# Patient Record
Sex: Male | Born: 1965 | Race: Black or African American | Hispanic: No | Marital: Single | Smoking: Current every day smoker
Health system: Southern US, Community
[De-identification: ages and names within clinical notes are randomized; demographics above are authoritative.]

## PROBLEM LIST (undated history)

## (undated) DIAGNOSIS — I10 Essential (primary) hypertension: Secondary | ICD-10-CM

## (undated) DIAGNOSIS — F32A Depression, unspecified: Secondary | ICD-10-CM

## (undated) DIAGNOSIS — S31139A Puncture wound of abdominal wall without foreign body, unspecified quadrant without penetration into peritoneal cavity, initial encounter: Secondary | ICD-10-CM

## (undated) DIAGNOSIS — S21139A Puncture wound without foreign body of unspecified front wall of thorax without penetration into thoracic cavity, initial encounter: Secondary | ICD-10-CM

---

## 2013-02-05 DIAGNOSIS — G8929 Other chronic pain: Secondary | ICD-10-CM | POA: Insufficient documentation

## 2013-02-05 DIAGNOSIS — G43909 Migraine, unspecified, not intractable, without status migrainosus: Secondary | ICD-10-CM | POA: Insufficient documentation

## 2015-04-04 DIAGNOSIS — T50902A Poisoning by unspecified drugs, medicaments and biological substances, intentional self-harm, initial encounter: Secondary | ICD-10-CM | POA: Insufficient documentation

## 2015-04-04 DIAGNOSIS — F32A Depression, unspecified: Secondary | ICD-10-CM | POA: Insufficient documentation

## 2015-05-15 DIAGNOSIS — S143XXA Injury of brachial plexus, initial encounter: Secondary | ICD-10-CM | POA: Insufficient documentation

## 2017-12-27 DIAGNOSIS — D509 Iron deficiency anemia, unspecified: Secondary | ICD-10-CM | POA: Insufficient documentation

## 2019-07-04 ENCOUNTER — Other Ambulatory Visit: Payer: Self-pay

## 2019-07-04 ENCOUNTER — Emergency Department: Payer: Medicare Other

## 2019-07-04 ENCOUNTER — Emergency Department
Admission: EM | Admit: 2019-07-04 | Discharge: 2019-07-04 | Disposition: A | Discharge status: Home / Self Care | Payer: Medicare Other | Attending: Emergency Medicine | Admitting: Emergency Medicine

## 2019-07-04 DIAGNOSIS — Z20822 Contact with and (suspected) exposure to covid-19: Secondary | ICD-10-CM | POA: Insufficient documentation

## 2019-07-04 DIAGNOSIS — I10 Essential (primary) hypertension: Secondary | ICD-10-CM | POA: Diagnosis not present

## 2019-07-04 DIAGNOSIS — J4521 Mild intermittent asthma with (acute) exacerbation: Secondary | ICD-10-CM | POA: Diagnosis not present

## 2019-07-04 DIAGNOSIS — R0602 Shortness of breath: Secondary | ICD-10-CM | POA: Diagnosis present

## 2019-07-04 LAB — CBC WITH DIFFERENTIAL/PLATELET
Abs Immature Granulocytes: 0.07 10*3/uL (ref 0.00–0.07)
Basophils Absolute: 0 10*3/uL (ref 0.0–0.1)
Basophils Relative: 1 %
Eosinophils Absolute: 0.2 10*3/uL (ref 0.0–0.5)
Eosinophils Relative: 3 %
HCT: 41.1 % (ref 39.0–52.0)
Hemoglobin: 12.6 g/dL — ABNORMAL LOW (ref 13.0–17.0)
Immature Granulocytes: 1 %
Lymphocytes Relative: 36 %
Lymphs Abs: 2.1 10*3/uL (ref 0.7–4.0)
MCH: 26.4 pg (ref 26.0–34.0)
MCHC: 30.7 g/dL (ref 30.0–36.0)
MCV: 86 fL (ref 80.0–100.0)
Monocytes Absolute: 0.4 10*3/uL (ref 0.1–1.0)
Monocytes Relative: 8 %
Neutro Abs: 3.1 10*3/uL (ref 1.7–7.7)
Neutrophils Relative %: 51 %
Platelets: 196 10*3/uL (ref 150–400)
RBC: 4.78 MIL/uL (ref 4.22–5.81)
RDW: 14.6 % (ref 11.5–15.5)
WBC: 5.8 10*3/uL (ref 4.0–10.5)
nRBC: 0 % (ref 0.0–0.2)

## 2019-07-04 LAB — BASIC METABOLIC PANEL
Anion gap: 10 (ref 5–15)
BUN: 10 mg/dL (ref 6–20)
CO2: 21 mmol/L — ABNORMAL LOW (ref 22–32)
Calcium: 8.9 mg/dL (ref 8.9–10.3)
Chloride: 111 mmol/L (ref 98–111)
Creatinine, Ser: 1.18 mg/dL (ref 0.61–1.24)
GFR calc Af Amer: >60 mL/min (ref >60)
GFR calc non Af Amer: >60 mL/min (ref >60)
Glucose, Bld: 114 mg/dL — ABNORMAL HIGH (ref 70–99)
Potassium: 3.4 mmol/L — ABNORMAL LOW (ref 3.5–5.1)
Sodium: 142 mmol/L (ref 135–145)

## 2019-07-04 LAB — POC SARS CORONAVIRUS 2 AG: SARS Coronavirus 2 Ag: NEGATIVE

## 2019-07-04 MED ORDER — PREDNISONE 20 MG PO TABS: 40.0000 mg | ORAL_TABLET | Freq: Every day | ORAL | 0 refills | Status: DC

## 2019-07-04 MED ORDER — AMLODIPINE BESYLATE 5 MG PO TABS
5.0000 mg | ORAL_TABLET | Freq: Once | ORAL | Status: AC
  Administered 2019-07-04: 5 mg via ORAL
  Filled 2019-07-04: qty 1

## 2019-07-04 MED ORDER — ALBUTEROL SULFATE (2.5 MG/3ML) 0.083% IN NEBU
5.0000 mg | INHALATION_SOLUTION | Freq: Once | RESPIRATORY_TRACT | Status: AC
  Administered 2019-07-04: 5 mg via RESPIRATORY_TRACT
  Filled 2019-07-04: qty 6

## 2019-07-04 MED ORDER — AMLODIPINE BESYLATE 5 MG PO TABS: 5.0000 mg | ORAL_TABLET | Freq: Every day | ORAL | 1 refills | Status: AC

## 2019-07-04 MED ORDER — AZITHROMYCIN 250 MG PO TABS: ORAL_TABLET | ORAL | 0 refills | Status: DC

## 2019-07-04 MED ORDER — IPRATROPIUM-ALBUTEROL 0.5-2.5 (3) MG/3ML IN SOLN
3.0000 mL | Freq: Once | RESPIRATORY_TRACT | Status: AC
  Administered 2019-07-04: 19:00:00 3 mL via RESPIRATORY_TRACT
  Filled 2019-07-04: qty 3

## 2019-07-04 MED ORDER — METHYLPREDNISOLONE SODIUM SUCC 125 MG IJ SOLR
125.0000 mg | Freq: Once | INTRAMUSCULAR | Status: AC
  Administered 2019-07-04: 19:00:00 125 mg via INTRAVENOUS
  Filled 2019-07-04: qty 2

## 2019-07-04 MED ORDER — MAGNESIUM SULFATE 2 GM/50ML IV SOLN
2.0000 g | INTRAVENOUS | Status: AC
  Administered 2019-07-04: 2 g via INTRAVENOUS
  Filled 2019-07-04: qty 50
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
## 2019-07-04 NOTE — ED Triage Notes (Signed)
Pt reports hx of astma, states that he started wheezing this am after attempting to smoke a cigarette, pt reports that he has the blue inhaler that doesn't help, he needs the red inhaler, pt was given 2 breathing treatments via ems on the way to ER, auditory wheezing noted, pt states that he feels a little better bp 172/112, heart rate of 104, 100% while receiving breathing treatment per ems, 97.2 oral temp, 18g left forearm

## 2019-10-10 ENCOUNTER — Ambulatory Visit: Payer: Medicare Other | Attending: Internal Medicine

## 2019-10-10 DIAGNOSIS — Z23 Encounter for immunization: Secondary | ICD-10-CM

## 2019-10-10 NOTE — Progress Notes (Signed)
   Covid-19 Vaccination Clinic  Name:  Ryan Stewart    MRN: [id number] DOB: 1966-05-02  10/10/2019  Mr. Ryan Stewart was observed post Covid-19 immunization for 15 minutes without incident. He was provided with Vaccine Information Sheet and instruction to access the V-Safe system.   Mr. Ryan Stewart was instructed to call 911 with any severe reactions post vaccine: Marland Kitchen Difficulty breathing  . Swelling of face and throat  . A fast heartbeat  . A bad rash all over body  . Dizziness and weakness   Immunizations Administered    Name Date Dose VIS Date Route   Pfizer COVID-19 Vaccine 10/10/2019  9:57 AM 0.3 mL 06/12/2019 Intramuscular   Manufacturer: [employer], [employer]   Lot: [id number]   NDC: [address]-8

## 2019-11-03 ENCOUNTER — Ambulatory Visit: Payer: Medicare Other | Attending: Internal Medicine

## 2019-11-03 DIAGNOSIS — Z23 Encounter for immunization: Secondary | ICD-10-CM

## 2019-11-03 NOTE — Progress Notes (Signed)
   Covid-19 Vaccination Clinic  Name:  Ryan Stewart    MRN: [id number] DOB: October 27, 1965  11/03/2019  [name] was observed post Covid-19 immunization for 15 minutes without incident. He was provided with Vaccine Information Sheet and instruction to access the V-Safe system.   [name] was instructed to call 911 with any severe reactions post vaccine: Marland Kitchen Difficulty breathing  . Swelling of face and throat  . A fast heartbeat  . A bad rash all over body  . Dizziness and weakness   Immunizations Administered    Name Date Dose VIS Date Route   Pfizer COVID-19 Vaccine 11/03/2019 12:01 PM 0.3 mL 08/26/2018 Intramuscular   Manufacturer: [employer], [employer]   Lot: [id number]   NDC: [address]-7

## 2020-07-15 ENCOUNTER — Ambulatory Visit
Admission: EM | Admit: 2020-07-15 | Discharge: 2020-07-15 | Disposition: A | Discharge status: Home / Self Care | Payer: Medicare Other | Attending: Family Medicine | Admitting: Family Medicine

## 2020-07-15 ENCOUNTER — Ambulatory Visit: Payer: Self-pay

## 2020-07-15 ENCOUNTER — Other Ambulatory Visit: Payer: Self-pay

## 2020-07-15 DIAGNOSIS — Z20822 Contact with and (suspected) exposure to covid-19: Secondary | ICD-10-CM | POA: Diagnosis not present

## 2020-07-15 DIAGNOSIS — U071 COVID-19: Secondary | ICD-10-CM | POA: Insufficient documentation

## 2020-07-15 NOTE — Discharge Instructions (Signed)

## 2020-07-15 NOTE — ED Triage Notes (Signed)
Patient states that he was exposed to covid and would like to be tested, currently without symptoms.

## 2020-07-16 LAB — SARS CORONAVIRUS 2 (TAT 6-24 HRS): SARS Coronavirus 2: POSITIVE — AB

## 2020-08-02 ENCOUNTER — Other Ambulatory Visit: Payer: Self-pay

## 2020-08-02 ENCOUNTER — Encounter: Payer: Self-pay | Admitting: Emergency Medicine

## 2020-08-02 ENCOUNTER — Emergency Department
Admission: EM | Admit: 2020-08-02 | Discharge: 2020-08-02 | Disposition: A | Discharge status: Home / Self Care | Payer: Medicare Other | Attending: Emergency Medicine | Admitting: Emergency Medicine

## 2020-08-02 DIAGNOSIS — Z8616 Personal history of COVID-19: Secondary | ICD-10-CM | POA: Insufficient documentation

## 2020-08-02 DIAGNOSIS — R112 Nausea with vomiting, unspecified: Secondary | ICD-10-CM | POA: Diagnosis present

## 2020-08-02 DIAGNOSIS — K529 Noninfective gastroenteritis and colitis, unspecified: Secondary | ICD-10-CM | POA: Diagnosis not present

## 2020-08-02 DIAGNOSIS — R55 Syncope and collapse: Secondary | ICD-10-CM | POA: Insufficient documentation

## 2020-08-02 DIAGNOSIS — R42 Dizziness and giddiness: Secondary | ICD-10-CM | POA: Insufficient documentation

## 2020-08-02 LAB — CBC WITH DIFFERENTIAL/PLATELET
Abs Immature Granulocytes: 0.09 10*3/uL — ABNORMAL HIGH (ref 0.00–0.07)
Basophils Absolute: 0 10*3/uL (ref 0.0–0.1)
Basophils Relative: 0 %
Eosinophils Absolute: 0 10*3/uL (ref 0.0–0.5)
Eosinophils Relative: 0 %
HCT: 47.1 % (ref 39.0–52.0)
Hemoglobin: 15.3 g/dL (ref 13.0–17.0)
Immature Granulocytes: 1 %
Lymphocytes Relative: 6 %
Lymphs Abs: 0.7 10*3/uL (ref 0.7–4.0)
MCH: 27 pg (ref 26.0–34.0)
MCHC: 32.5 g/dL (ref 30.0–36.0)
MCV: 83.1 fL (ref 80.0–100.0)
Monocytes Absolute: 0.6 10*3/uL (ref 0.1–1.0)
Monocytes Relative: 5 %
Neutro Abs: 10.4 10*3/uL — ABNORMAL HIGH (ref 1.7–7.7)
Neutrophils Relative %: 88 %
Platelets: 231 10*3/uL (ref 150–400)
RBC: 5.67 MIL/uL (ref 4.22–5.81)
RDW: 15.7 % — ABNORMAL HIGH (ref 11.5–15.5)
WBC: 11.8 10*3/uL — ABNORMAL HIGH (ref 4.0–10.5)
nRBC: 0 % (ref 0.0–0.2)

## 2020-08-02 LAB — COMPREHENSIVE METABOLIC PANEL
ALT: 26 U/L (ref 0–44)
AST: 42 U/L — ABNORMAL HIGH (ref 15–41)
Albumin: 4.2 g/dL (ref 3.5–5.0)
Alkaline Phosphatase: 65 U/L (ref 38–126)
Anion gap: 12 (ref 5–15)
BUN: 16 mg/dL (ref 6–20)
CO2: 25 mmol/L (ref 22–32)
Calcium: 9.3 mg/dL (ref 8.9–10.3)
Chloride: 104 mmol/L (ref 98–111)
Creatinine, Ser: 1.42 mg/dL — ABNORMAL HIGH (ref 0.61–1.24)
GFR, Estimated: 59 mL/min — ABNORMAL LOW (ref >60)
Glucose, Bld: 120 mg/dL — ABNORMAL HIGH (ref 70–99)
Potassium: 5 mmol/L (ref 3.5–5.1)
Sodium: 141 mmol/L (ref 135–145)
Total Bilirubin: 1.4 mg/dL — ABNORMAL HIGH (ref 0.3–1.2)
Total Protein: 8.1 g/dL (ref 6.5–8.1)

## 2020-08-02 LAB — LIPASE, BLOOD: Lipase: 28 U/L (ref 11–51)

## 2020-08-02 MED ORDER — ONDANSETRON HCL 4 MG/2ML IJ SOLN
4.0000 mg | Freq: Once | INTRAMUSCULAR | Status: AC
  Administered 2020-08-02: 4 mg via INTRAVENOUS
  Filled 2020-08-02: qty 2

## 2020-08-02 MED ORDER — ONDANSETRON 4 MG PO TBDP: 4.0000 mg | ORAL_TABLET | Freq: Three times a day (TID) | ORAL | 0 refills | Status: DC | PRN

## 2020-08-02 MED ORDER — LACTATED RINGERS IV BOLUS
1000.0000 mL | Freq: Once | INTRAVENOUS | Status: AC
  Administered 2020-08-02: 1000 mL via INTRAVENOUS
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
## 2020-08-02 NOTE — ED Provider Notes (Signed)
[facility] Emergency Department Provider Note   ____________________________________________   Event Date/Time   First MD Initiated Contact with Patient 08/02/20 1644     (approximate)  I have reviewed the triage vital signs and the nursing notes.   HISTORY  Chief Complaint Emesis (s) and Near Syncope    HPI Ryan Stewart is a 55 y.o. male with no significant past medical history who presents to the ED complaining of vomiting.  Patient reports that he started feeling nauseous with multiple episodes of vomiting after eating at [facility] last night.  He complains of diffuse abdominal cramping and he has had a couple episodes of diarrhea today.  He began feeling nauseous and lightheaded in the car with his wife today and asked his wife to pull over.  When the patient stepped out of the car he felt even more lightheaded and had a near syncopal event.  He had a watery bowel movement at the same time as feeling near syncopal.  He denies any associated chest pain or shortness of breath.  He continues to complain of nausea, denies abdominal pain currently.  He was diagnosed with COVID-19 on January 14, but was feeling much better up until yesterday.        History reviewed. No pertinent past medical history.  There are no problems to display for this patient.   History reviewed. No pertinent surgical history.  Prior to Admission medications   Medication Sig Start Date End Date Taking? Authorizing Provider  ondansetron (ZOFRAN ODT) 4 MG disintegrating tablet Take 1 tablet (4 mg total) by mouth every 8 (eight) hours as needed for nausea or vomiting. 08/02/20  Yes Chesley Noon, MD  amLODipine (NORVASC) 5 MG tablet Take 1 tablet (5 mg total) by mouth daily. 07/04/19   Sharman Cheek, MD  azithromycin (ZITHROMAX Z-PAK) 250 MG tablet Take 2 tablets (500 mg) on  Day 1,  followed by 1 tablet (250 mg) once daily on Days 2 through 5. 07/04/19   Sharman Cheek, MD   predniSONE (DELTASONE) 20 MG tablet Take 2 tablets (40 mg total) by mouth daily. 07/04/19   Sharman Cheek, MD    Allergies Patient has no known allergies.  History reviewed. No pertinent family history.  Social History Social History   Tobacco Use  . Smoking status: Never Smoker  . Smokeless tobacco: Never Used    Review of Systems  Constitutional: No fever/chills Eyes: No visual changes. ENT: No sore throat. Cardiovascular: Denies chest pain.  Positive for lightheadedness and near syncope. Respiratory: Denies shortness of breath. Gastrointestinal: Positive for abdominal pain, nausea, vomiting, and diarrhea.  No constipation. Genitourinary: Negative for dysuria. Musculoskeletal: Negative for back pain. Skin: Negative for rash. Neurological: Negative for headaches, focal weakness or numbness.  ____________________________________________   PHYSICAL EXAM:  VITAL SIGNS: ED Triage Vitals  Enc Vitals Group     BP      Pulse      Resp      Temp      Temp src      SpO2      Weight      Height      Head Circumference      Peak Flow      Pain Score      Pain Loc      Pain Edu?      Excl. in GC?     Constitutional: Alert and oriented. Eyes: Conjunctivae are normal. Head: Atraumatic. Nose: No congestion/rhinnorhea. Mouth/Throat: Mucous  membranes are moist. Neck: Normal ROM Cardiovascular: Normal rate, regular rhythm. Grossly normal heart sounds.  2+ radial pulses bilaterally. Respiratory: Normal respiratory effort.  No retractions. Lungs CTAB. Gastrointestinal: Soft and nontender. No distention. Genitourinary: deferred Musculoskeletal: No lower extremity tenderness nor edema. Neurologic:  Normal speech and language. No gross focal neurologic deficits are appreciated. Skin:  Skin is warm, dry and intact. No rash noted. Psychiatric: Mood and affect are normal. Speech and behavior are normal.  ____________________________________________   LABS (all labs  ordered are listed, but only abnormal results are displayed)  Labs Reviewed  CBC WITH DIFFERENTIAL/PLATELET - Abnormal; Notable for the following components:      Result Value   WBC 11.8 (*)    RDW 15.7 (*)    Neutro Abs 10.4 (*)    Abs Immature Granulocytes 0.09 (*)    All other components within normal limits  COMPREHENSIVE METABOLIC PANEL - Abnormal; Notable for the following components:   Glucose, Bld 120 (*)    Creatinine, Ser 1.42 (*)    AST 42 (*)    Total Bilirubin 1.4 (*)    GFR, Estimated 59 (*)    All other components within normal limits  LIPASE, BLOOD   ____________________________________________  EKG  ED ECG REPORT I, Chesley Noon, the attending physician, personally viewed and interpreted this ECG.   Date: 08/02/2020  EKG Time: 18:41  Rate: 88  Rhythm: normal sinus rhythm  Axis: Normal  Intervals:none  ST&T Change: None   PROCEDURES  Procedure(s) performed (including Critical Care):  Procedures   ____________________________________________   INITIAL IMPRESSION / ASSESSMENT AND PLAN / ED COURSE       55 year old male with with no significant past medical history presents to the ED complaining of nausea and vomiting since last night with diarrhea starting today and an episode of near syncope with diarrhea this afternoon.  He denies any associated chest pain or shortness of breath, we will check EKG but episode sounds most consistent with vasovagal episode or dehydration secondary to vomiting and diarrhea.  Labs thus far are unremarkable, electrolytes within normal limits.  We will treat patient with Zofran and hydrate with IV fluids.  Patient reports feeling better following dose of Zofran and start of IV fluids.  EKG shows no evidence of arrhythmia or ischemia and I suspect his near syncopal episode was due to dehydration.  He is appropriate for discharge home with PCP follow-up and will be prescribed Zofran, was counseled to drink plenty of fluids  and return to the ED for new worsening symptoms.      ____________________________________________   FINAL CLINICAL IMPRESSION(S) / ED DIAGNOSES  Final diagnoses:  Near syncope  Gastroenteritis     ED Discharge Orders         Ordered    ondansetron (ZOFRAN ODT) 4 MG disintegrating tablet  Every 8 hours PRN        08/02/20 1908           Note:  This document was prepared using Dragon voice recognition software and may include unintentional dictation errors.   Chesley Noon, MD 08/02/20 [phone]
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
## 2021-03-22 ENCOUNTER — Other Ambulatory Visit: Payer: Self-pay

## 2021-03-22 ENCOUNTER — Ambulatory Visit
Admission: EM | Admit: 2021-03-22 | Discharge: 2021-03-22 | Disposition: A | Discharge status: Home / Self Care | Payer: Medicare Other | Attending: Family Medicine | Admitting: Family Medicine

## 2021-03-22 DIAGNOSIS — B20 Human immunodeficiency virus [HIV] disease: Secondary | ICD-10-CM | POA: Insufficient documentation

## 2021-03-22 DIAGNOSIS — Z20822 Contact with and (suspected) exposure to covid-19: Secondary | ICD-10-CM | POA: Diagnosis not present

## 2021-03-22 HISTORY — DX: Puncture wound without foreign body of unspecified front wall of thorax without penetration into thoracic cavity, initial encounter: S21.139A

## 2021-03-22 HISTORY — DX: Puncture wound of abdominal wall without foreign body, unspecified quadrant without penetration into peritoneal cavity, initial encounter: S31.139A

## 2021-03-22 HISTORY — DX: Essential (primary) hypertension: I10

## 2021-03-22 HISTORY — DX: Depression, unspecified: F32.A

## 2021-03-22 NOTE — ED Triage Notes (Signed)
Pt presents with c/o intermittent fever along with nasal congestion x 1 day. Denies cough, SOB. Direct exposure to COVID yesterday.  Nyquil, cold medicine helped.  (Pt BP 166/108 in triage - pt states he has HTN meds but doesn't take them- says it's not like he's passing out or dying or anything.)

## 2021-03-22 NOTE — Discharge Instructions (Signed)
Results should be back in the am.  Check your mychart account.  If results are positive, you will get a phone call from the nurse.
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
## 2021-03-28 ENCOUNTER — Telehealth (HOSPITAL_COMMUNITY): Payer: Self-pay | Admitting: Emergency Medicine

## 2021-03-28 NOTE — Telephone Encounter (Signed)
Patient called on 9/26 looking for results of COVID test.  Upon chart review, lab was cancelled 3 days after test was collected.  Called lab for f/u, no notes in chart for notification.  They are unable to determine what happened.  Informed patient of the missing result and apologies provided.  Told patient retesting at this time will not give him any useful information.  Patient states all symptoms have resolved and he is feeling good, no need for re-evaluation at this time.  Patient verbalized understanding and will follow-up if he has any questions.

## 2024-07-25 ENCOUNTER — Other Ambulatory Visit: Payer: Self-pay

## 2024-07-25 ENCOUNTER — Emergency Department
Admission: EM | Admit: 2024-07-25 | Discharge: 2024-07-25 | Disposition: A | Discharge status: Home / Self Care | Attending: Emergency Medicine | Admitting: Emergency Medicine

## 2024-07-25 DIAGNOSIS — Z21 Asymptomatic human immunodeficiency virus [HIV] infection status: Secondary | ICD-10-CM | POA: Diagnosis not present

## 2024-07-25 DIAGNOSIS — R1033 Periumbilical pain: Secondary | ICD-10-CM | POA: Insufficient documentation

## 2024-07-25 DIAGNOSIS — R112 Nausea with vomiting, unspecified: Secondary | ICD-10-CM | POA: Insufficient documentation

## 2024-07-25 LAB — CBC WITH DIFFERENTIAL/PLATELET
Abs Immature Granulocytes: 0.11 10*3/uL — ABNORMAL HIGH (ref 0.00–0.07)
Basophils Absolute: 0 10*3/uL (ref 0.0–0.1)
Basophils Relative: 0 %
Eosinophils Absolute: 0.1 10*3/uL (ref 0.0–0.5)
Eosinophils Relative: 1 %
HCT: 52.6 % — ABNORMAL HIGH (ref 39.0–52.0)
Hemoglobin: 16.9 g/dL (ref 13.0–17.0)
Immature Granulocytes: 1 %
Lymphocytes Relative: 10 %
Lymphs Abs: 1.1 10*3/uL (ref 0.7–4.0)
MCH: 27.3 pg (ref 26.0–34.0)
MCHC: 32.1 g/dL (ref 30.0–36.0)
MCV: 85.1 fL (ref 80.0–100.0)
Monocytes Absolute: 0.6 10*3/uL (ref 0.1–1.0)
Monocytes Relative: 6 %
Neutro Abs: 8.6 10*3/uL — ABNORMAL HIGH (ref 1.7–7.7)
Neutrophils Relative %: 82 %
Platelets: 209 10*3/uL (ref 150–400)
RBC: 6.18 MIL/uL — ABNORMAL HIGH (ref 4.22–5.81)
RDW: 15.5 % (ref 11.5–15.5)
WBC: 10.5 10*3/uL (ref 4.0–10.5)
nRBC: 0 % (ref 0.0–0.2)

## 2024-07-25 LAB — COMPREHENSIVE METABOLIC PANEL WITH GFR
ALT: 50 U/L — ABNORMAL HIGH (ref 0–44)
AST: 53 U/L — ABNORMAL HIGH (ref 15–41)
Albumin: 4.8 g/dL (ref 3.5–5.0)
Alkaline Phosphatase: 86 U/L (ref 38–126)
Anion gap: 15 (ref 5–15)
BUN: 22 mg/dL — ABNORMAL HIGH (ref 6–20)
CO2: 18 mmol/L — ABNORMAL LOW (ref 22–32)
Calcium: 9.9 mg/dL (ref 8.9–10.3)
Chloride: 106 mmol/L (ref 98–111)
Creatinine, Ser: 1.39 mg/dL — ABNORMAL HIGH (ref 0.61–1.24)
GFR, Estimated: 59 mL/min — ABNORMAL LOW
Glucose, Bld: 131 mg/dL — ABNORMAL HIGH (ref 70–99)
Potassium: 4.4 mmol/L (ref 3.5–5.1)
Sodium: 139 mmol/L (ref 135–145)
Total Bilirubin: 0.5 mg/dL (ref 0.0–1.2)
Total Protein: 8.1 g/dL (ref 6.5–8.1)

## 2024-07-25 LAB — LIPASE, BLOOD: Lipase: 42 U/L (ref 11–51)

## 2024-07-25 MED ORDER — ONDANSETRON HCL 4 MG/2ML IJ SOLN
4.0000 mg | Freq: Once | INTRAMUSCULAR | Status: AC
  Administered 2024-07-25: 4 mg via INTRAVENOUS
  Filled 2024-07-25: qty 2

## 2024-07-25 MED ORDER — MORPHINE SULFATE (PF) 4 MG/ML IV SOLN
4.0000 mg | Freq: Once | INTRAVENOUS | Status: AC
  Administered 2024-07-25: 4 mg via INTRAVENOUS
  Filled 2024-07-25: qty 1

## 2024-07-25 MED ORDER — ONDANSETRON 8 MG PO TBDP: 8.0000 mg | ORAL_TABLET | Freq: Three times a day (TID) | ORAL | 0 refills | Status: AC | PRN

## 2024-07-25 MED ORDER — OXYCODONE-ACETAMINOPHEN 5-325 MG PO TABS: 1.0000 | ORAL_TABLET | ORAL | 0 refills | Status: AC | PRN

## 2024-07-25 MED ORDER — SODIUM CHLORIDE 0.9 % IV BOLUS
1000.0000 mL | Freq: Once | INTRAVENOUS | Status: AC
  Administered 2024-07-25: 1000 mL via INTRAVENOUS
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
